# Patient Record
Sex: Male | Born: 1970 | Race: White | Hispanic: No | Marital: Single | Smoking: Former smoker
Health system: Southern US, Community
[De-identification: ages and names within clinical notes are randomized; demographics above are authoritative.]

## PROBLEM LIST (undated history)

## (undated) DIAGNOSIS — I10 Essential (primary) hypertension: Secondary | ICD-10-CM

## (undated) HISTORY — PX: APPENDECTOMY: SHX54

## (undated) HISTORY — PX: CHOLECYSTECTOMY: SHX55

---

## 2018-06-16 ENCOUNTER — Emergency Department (HOSPITAL_COMMUNITY): Payer: No Typology Code available for payment source

## 2018-06-16 ENCOUNTER — Emergency Department (HOSPITAL_COMMUNITY)
Admission: EM | Admit: 2018-06-16 | Discharge: 2018-06-16 | Disposition: A | Discharge status: Home / Self Care | Payer: No Typology Code available for payment source | Attending: Emergency Medicine | Admitting: Emergency Medicine

## 2018-06-16 ENCOUNTER — Encounter (HOSPITAL_COMMUNITY): Payer: Self-pay | Admitting: *Deleted

## 2018-06-16 ENCOUNTER — Other Ambulatory Visit: Payer: Self-pay

## 2018-06-16 DIAGNOSIS — R05 Cough: Secondary | ICD-10-CM | POA: Insufficient documentation

## 2018-06-16 DIAGNOSIS — I10 Essential (primary) hypertension: Secondary | ICD-10-CM | POA: Diagnosis not present

## 2018-06-16 DIAGNOSIS — Z87891 Personal history of nicotine dependence: Secondary | ICD-10-CM | POA: Diagnosis not present

## 2018-06-16 DIAGNOSIS — R059 Cough, unspecified: Secondary | ICD-10-CM

## 2018-06-16 HISTORY — DX: Essential (primary) hypertension: I10

## 2018-06-16 MED ORDER — ALBUTEROL SULFATE HFA 108 (90 BASE) MCG/ACT IN AERS
2.0000 | INHALATION_SPRAY | Freq: Once | RESPIRATORY_TRACT | Status: AC
  Administered 2018-06-16: 2 via RESPIRATORY_TRACT
  Filled 2018-06-16: qty 6.7

## 2018-06-16 MED ORDER — BENZONATATE 200 MG PO CAPS: 200.0000 mg | ORAL_CAPSULE | Freq: Three times a day (TID) | ORAL | 0 refills | Status: AC | PRN

## 2018-06-16 MED ORDER — PREDNISONE 20 MG PO TABS: 40.0000 mg | ORAL_TABLET | Freq: Every day | ORAL | 0 refills | Status: AC

## 2018-06-16 NOTE — ED Provider Notes (Signed)
[facility]ERGENCY DEPARTMENT Provider Note   CSN: [id number] Arrival date & time: 06/16/18  1415    History   Chief Complaint Chief Complaint  Patient presents with  . Cough    HPI Steva ColderJames Walraven is a 48 y.o. male.     HPI   Steva ColderJames Clift is a 48 y.o. male who presents to the Emergency Department complaining of cough for 1 week.  He states the cough is productive at times of clear to yellow sputum and associated with a "rattle" in his central chest that clears after cough.  Cough has also been associated with nasal congestion and he states he suffers from some seasonal allergies.  He denies shortness of breath, fever, chills, shortness of breath and body aches.  No known COVID exposures.  No hx of asthma.      Past Medical History:  Diagnosis Date  . Hypertension     There are no active problems to display for this patient.   Past Surgical History:  Procedure Laterality Date  . APPENDECTOMY    . CHOLECYSTECTOMY        Home Medications    Prior to Admission medications   Medication Sig Start Date End Date Taking? Authorizing Provider  benzonatate (TESSALON) 200 MG capsule Take 1 capsule (200 mg total) by mouth 3 (three) times daily as needed for cough. Swallow whole, do not chew 06/16/18   Krysten Veronica, PA-C  predniSONE (DELTASONE) 20 MG tablet Take 2 tablets (40 mg total) by mouth daily. For 5 days 06/16/18   Pauline Ausriplett, Kayley Zeiders, PA-C    Family History History reviewed. No pertinent family history.  Social History Social History   Tobacco Use  . Smoking status: Former Games developermoker  . Smokeless tobacco: Never Used  Substance Use Topics  . Alcohol use: Yes  . Drug use: Yes    Types: Marijuana, Cocaine     Allergies   Patient has no known allergies.   Review of Systems Review of Systems  Constitutional: Negative for appetite change, chills and fever.  HENT: Positive for congestion and rhinorrhea. Negative for sore throat and trouble swallowing.    Respiratory: Positive for cough. Negative for chest tightness, shortness of breath and wheezing.   Cardiovascular: Negative for chest pain.  Gastrointestinal: Negative for abdominal pain, nausea and vomiting.  Genitourinary: Negative for dysuria.  Musculoskeletal: Negative for arthralgias, myalgias and neck pain.  Skin: Negative for rash.  Neurological: Negative for dizziness, weakness and numbness.  Hematological: Negative for adenopathy.     Physical Exam Updated Vital Signs BP (!) 133/91 (BP Location: Left Arm)   Pulse 85   Temp 98.5 F (36.9 C) (Oral)   Resp 16   Ht 6\' 1"  (1.854 m)   Wt 113.4 kg   SpO2 99%   BMI 32.98 kg/m   Physical Exam Vitals signs and nursing note reviewed.  Constitutional:      Appearance: Normal appearance. He is not ill-appearing.  HENT:     Nose: Nose normal.     Mouth/Throat:     Mouth: Mucous membranes are moist.     Pharynx: Oropharynx is clear. No oropharyngeal exudate or posterior oropharyngeal erythema.  Cardiovascular:     Rate and Rhythm: Normal rate and regular rhythm.     Pulses: Normal pulses.  Pulmonary:     Effort: Pulmonary effort is normal.     Breath sounds: Normal breath sounds. No stridor. No wheezing, rhonchi or rales.  Chest:     Chest wall: No  tenderness.  Musculoskeletal: Normal range of motion.  Skin:    General: Skin is warm.     Capillary Refill: Capillary refill takes less than 2 seconds.     Findings: No rash.      ED Treatments / Results  Labs (all labs ordered are listed, but only abnormal results are displayed) Labs Reviewed - No data to display  EKG None  Radiology Dg Chest 2 View  Result Date: 06/16/2018 CLINICAL DATA:  Cough for 1 week EXAM: CHEST - 2 VIEW COMPARISON:  None. FINDINGS: Normal heart size. Normal mediastinal contour. No pneumothorax. No pleural effusion. Lungs appear clear, with no acute consolidative airspace disease and no pulmonary edema. Cholecystectomy clips are seen in the  right upper quadrant of the abdomen. IMPRESSION: No active cardiopulmonary disease. Electronically Signed   By: Delbert Phenix M.D.   On: 06/16/2018 14:43    Procedures Procedures (including critical care time)  Medications Ordered in ED Medications  albuterol (VENTOLIN HFA) 108 (90 Base) MCG/ACT inhaler 2 puff (2 puffs Inhalation Given 06/16/18 1501)     Initial Impression / Assessment and Plan / ED Course  I have reviewed the triage vital signs and the nursing notes.  Pertinent labs & imaging results that were available during my care of the patient were reviewed by me and considered in my medical decision making (see chart for details).         Pt well appearing, no respiratory distress.  Vitals reassuring.  CXR neg for PNA.  Pt appears appropriate for d/c home.  Likely viral process, He will be given instructions for home isolation, work note and strict return precautions.   Dispensed albuterol MDI for home use.      Final Clinical Impressions(s) / ED Diagnoses   Final diagnoses:  Cough    ED Discharge Orders         Ordered    predniSONE (DELTASONE) 20 MG tablet  Daily     06/16/18 1503    benzonatate (TESSALON) 200 MG capsule  3 times daily PRN     06/16/18 1503           Pauline Aus, PA-C 06/16/18 1549    Benjiman Core, MD 06/17/18 1513

## 2018-06-16 NOTE — Discharge Instructions (Addendum)
1-2 puffs of the albuterol inhaler every 4-6 hrs as needed.  Please stay at home for 7 days and at least 3 consecutive days without fever or other symptoms.  Return to ER for any worsening symptoms such as fever or shortness of breath

## 2018-06-16 NOTE — ED Notes (Signed)
Cough x 1 week

## 2018-06-16 NOTE — ED Triage Notes (Signed)
Pt with productive cough for a week, yellow in color per pt and has gotten worse.  Pt denies fever or chills. Denies PCP

## 2020-12-17 IMAGING — DX CHEST - 2 VIEW
2 series · 2 of 2 positions shown · non-contrast
Comparison: None.

CLINICAL DATA: Cough for 1 week

EXAM:
CHEST - 2 VIEW

[chest pa]
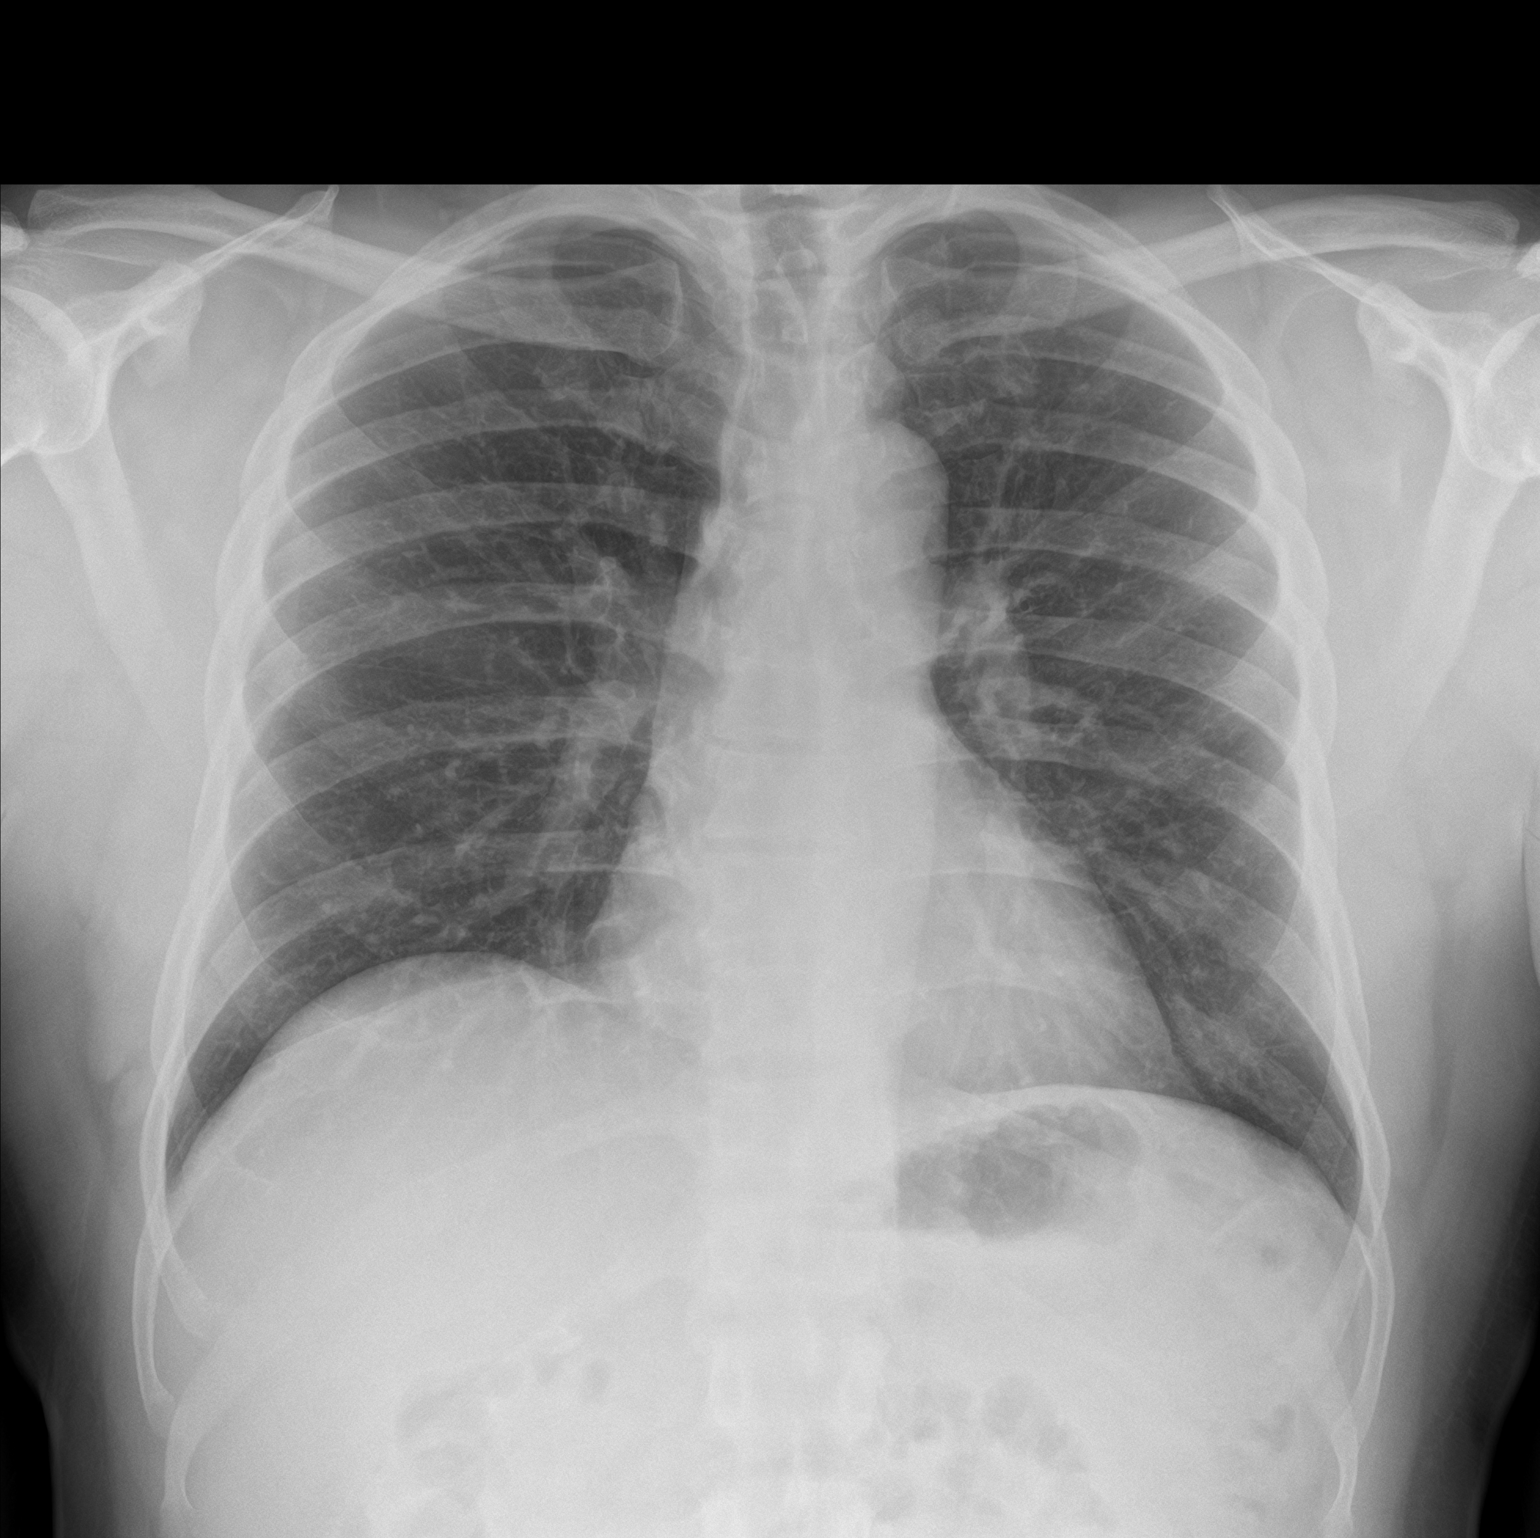

[chest lat]
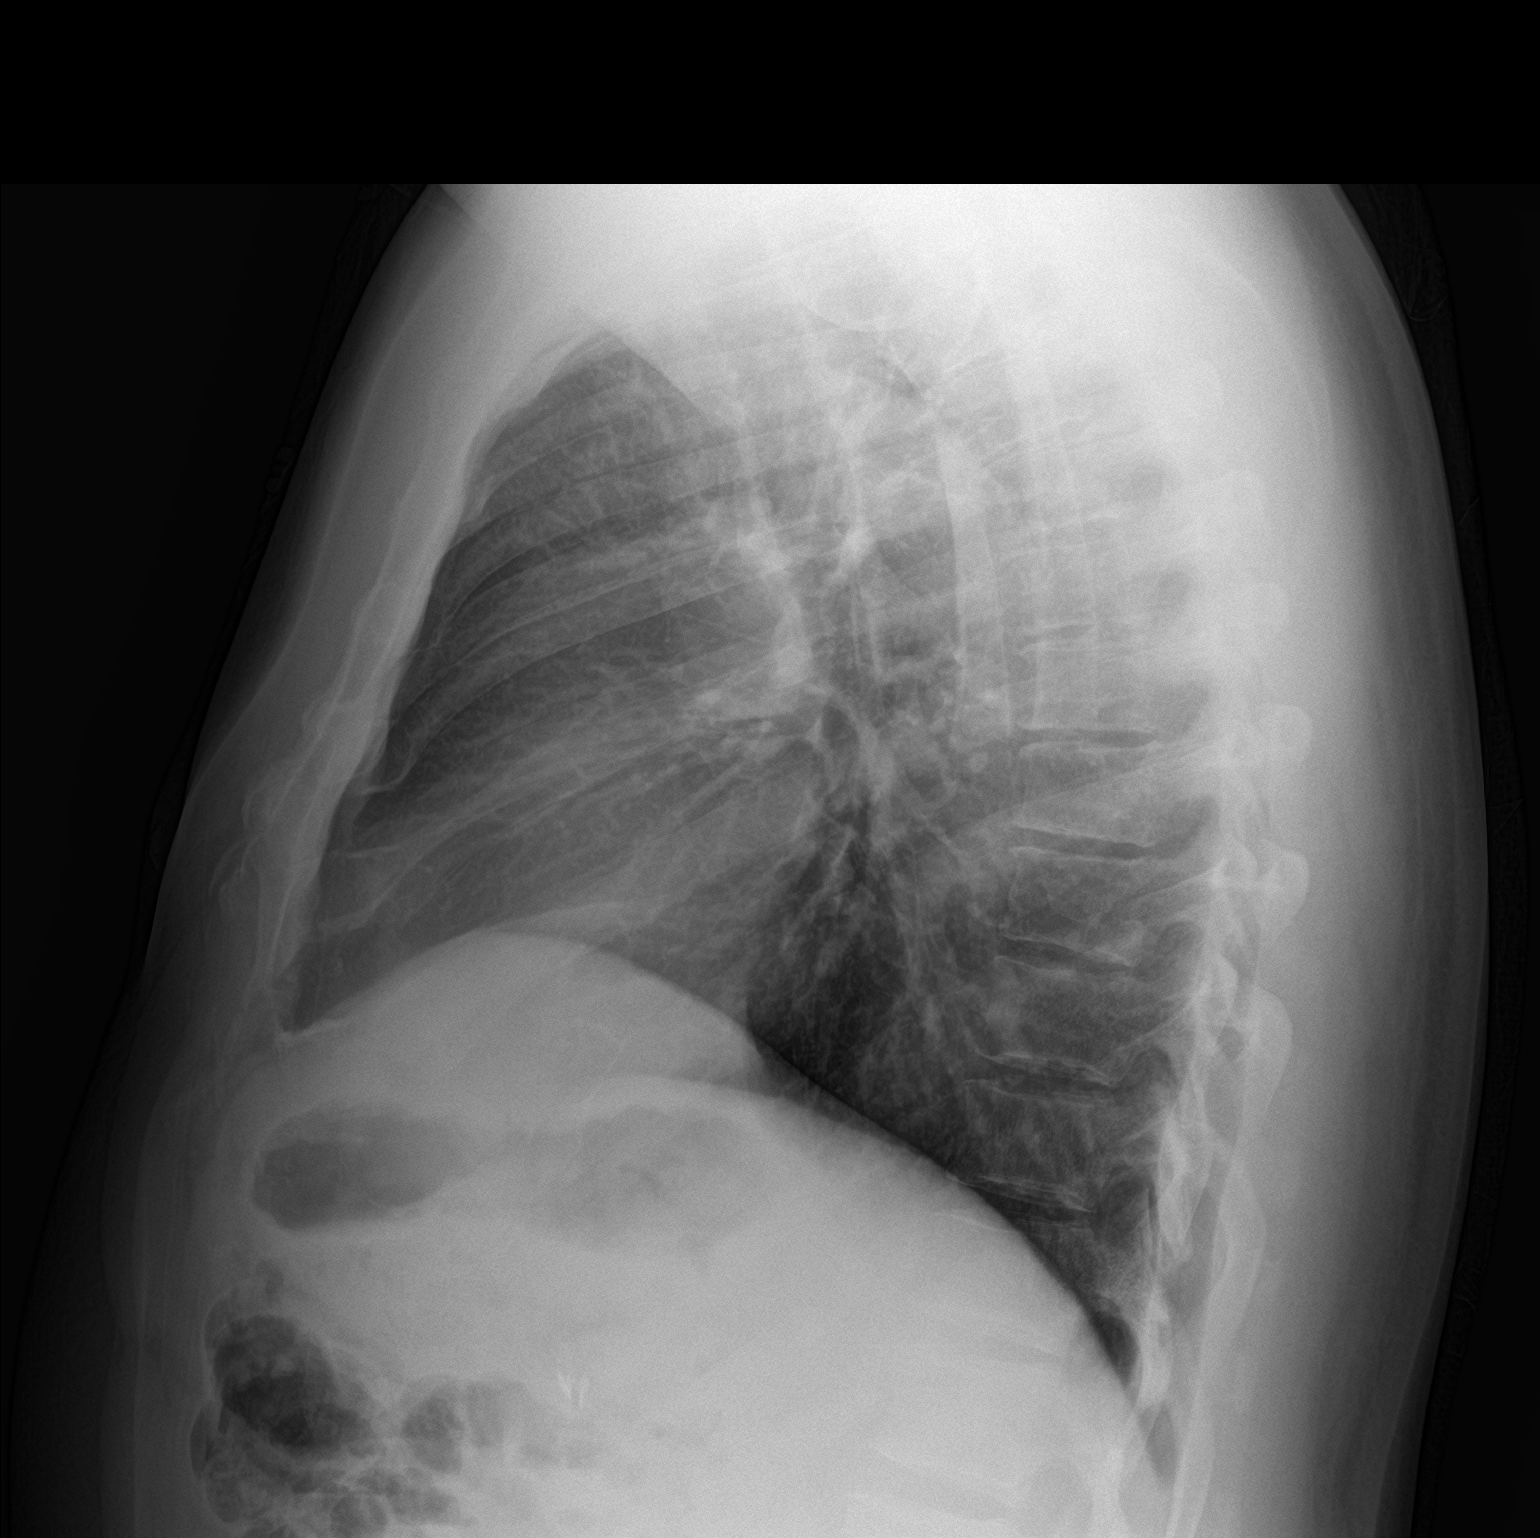

[2 of 2 positions shown; findings below may reference images not displayed]

FINDINGS: Normal heart size. Normal mediastinal contour. No pneumothorax. No
pleural effusion. Lungs appear clear, with no acute consolidative
airspace disease and no pulmonary edema. Cholecystectomy clips are
seen in the right upper quadrant of the abdomen.
IMPRESSION: No active cardiopulmonary disease.
